# Patient Record
Sex: Male | Born: 1935 | Race: White | Hispanic: No | Marital: Married
Health system: Southern US, Community
[De-identification: ages and names within clinical notes are randomized; demographics above are authoritative.]

---

## 2004-12-17 ENCOUNTER — Inpatient Hospital Stay: Payer: Self-pay | Admitting: Anesthesiology

## 2013-07-12 ENCOUNTER — Emergency Department: Payer: Self-pay | Admitting: Emergency Medicine

## 2013-07-12 ENCOUNTER — Ambulatory Visit: Payer: Self-pay | Admitting: Urology

## 2013-07-12 LAB — BASIC METABOLIC PANEL
Anion Gap: 5 — ABNORMAL LOW (ref 7–16)
BUN: 38 mg/dL — ABNORMAL HIGH (ref 7–18)
Calcium, Total: 8.9 mg/dL (ref 8.5–10.1)
Chloride: 107 mmol/L (ref 98–107)
EGFR (African American): 39 — ABNORMAL LOW
Osmolality: 289 (ref 275–301)
Sodium: 140 mmol/L (ref 136–145)

## 2013-07-12 LAB — CBC
HCT: 29.9 % — ABNORMAL LOW (ref 40.0–52.0)
HGB: 9.4 g/dL — ABNORMAL LOW (ref 13.0–18.0)
MCH: 26 pg (ref 26.0–34.0)
MCV: 83 fL (ref 80–100)
Platelet: 237 10*3/uL (ref 150–440)
RBC: 3.62 10*6/uL — ABNORMAL LOW (ref 4.40–5.90)
RDW: 27.1 % — ABNORMAL HIGH (ref 11.5–14.5)
WBC: 44 10*3/uL — ABNORMAL HIGH (ref 3.8–10.6)

## 2013-07-13 ENCOUNTER — Emergency Department: Payer: Self-pay | Admitting: Emergency Medicine

## 2013-07-13 LAB — PROTIME-INR
INR: 1.2
Prothrombin Time: 15.3 secs — ABNORMAL HIGH (ref 11.5–14.7)

## 2013-07-13 LAB — URINALYSIS, COMPLETE
Ketone: NEGATIVE
Ketone: NEGATIVE
Leukocyte Esterase: NEGATIVE
Nitrite: NEGATIVE
Ph: 5 (ref 4.5–8.0)
Protein: 30
RBC,UR: 3185 /HPF (ref 0–5)
RBC,UR: 68 /HPF (ref 0–5)
Specific Gravity: 1.013 (ref 1.003–1.030)
Specific Gravity: 1.014 (ref 1.003–1.030)
Squamous Epithelial: <1
Squamous Epithelial: NONE SEEN
WBC UR: 5 /HPF (ref 0–5)

## 2013-07-13 LAB — CBC
HCT: 28 % — ABNORMAL LOW (ref 40.0–52.0)
MCH: 26.3 pg (ref 26.0–34.0)
MCV: 83 fL (ref 80–100)
Platelet: 248 10*3/uL (ref 150–440)
RBC: 3.39 10*6/uL — ABNORMAL LOW (ref 4.40–5.90)
RDW: 26.8 % — ABNORMAL HIGH (ref 11.5–14.5)

## 2013-07-13 LAB — BASIC METABOLIC PANEL
Calcium, Total: 8.4 mg/dL — ABNORMAL LOW (ref 8.5–10.1)
Chloride: 109 mmol/L — ABNORMAL HIGH (ref 98–107)
Co2: 26 mmol/L (ref 21–32)
Creatinine: 1.95 mg/dL — ABNORMAL HIGH (ref 0.60–1.30)
EGFR (African American): 35 — ABNORMAL LOW
Osmolality: 291 (ref 275–301)
Potassium: 3.9 mmol/L (ref 3.5–5.1)
Sodium: 141 mmol/L (ref 136–145)

## 2013-08-14 LAB — COMPREHENSIVE METABOLIC PANEL
Anion Gap: 5 — ABNORMAL LOW (ref 7–16)
BUN: 16 mg/dL (ref 7–18)
Bilirubin,Total: 0.3 mg/dL (ref 0.2–1.0)
Calcium, Total: 8.3 mg/dL — ABNORMAL LOW (ref 8.5–10.1)
Chloride: 103 mmol/L (ref 98–107)
Co2: 30 mmol/L (ref 21–32)
EGFR (Non-African Amer.): 46 — ABNORMAL LOW
Glucose: 95 mg/dL (ref 65–99)
Osmolality: 277 (ref 275–301)
Potassium: 3.4 mmol/L — ABNORMAL LOW (ref 3.5–5.1)
SGOT(AST): 19 U/L (ref 15–37)
Sodium: 138 mmol/L (ref 136–145)
Total Protein: 7 g/dL (ref 6.4–8.2)

## 2013-08-14 LAB — CBC
HCT: 24.3 % — ABNORMAL LOW (ref 40.0–52.0)
HGB: 7.4 g/dL — ABNORMAL LOW (ref 13.0–18.0)
MCH: 25.6 pg — ABNORMAL LOW (ref 26.0–34.0)
MCHC: 30.6 g/dL — ABNORMAL LOW (ref 32.0–36.0)
MCV: 83 fL (ref 80–100)
RBC: 2.91 10*6/uL — ABNORMAL LOW (ref 4.40–5.90)
RDW: 22.1 % — ABNORMAL HIGH (ref 11.5–14.5)

## 2013-08-14 LAB — URINALYSIS, COMPLETE
Bilirubin,UR: NEGATIVE
Ketone: NEGATIVE
Nitrite: NEGATIVE
Ph: 6 (ref 4.5–8.0)
Protein: 100
Specific Gravity: 1.012 (ref 1.003–1.030)
Squamous Epithelial: NONE SEEN
WBC UR: 521 /HPF (ref 0–5)

## 2013-08-14 LAB — LIPASE, BLOOD: Lipase: 217 U/L

## 2013-08-15 ENCOUNTER — Inpatient Hospital Stay: Payer: Self-pay | Admitting: Internal Medicine

## 2013-08-15 LAB — IRON AND TIBC
Iron: 25 ug/dL — ABNORMAL LOW (ref 65–175)
Unbound Iron-Bind.Cap.: 149 ug/dL

## 2013-08-15 LAB — PROTIME-INR
INR: 1.2
Prothrombin Time: 15.3 secs — ABNORMAL HIGH (ref 11.5–14.7)

## 2013-08-15 LAB — FOLATE: Folic Acid: 15.6 ng/mL (ref 3.1–100.0)

## 2013-08-15 LAB — BASIC METABOLIC PANEL
Anion Gap: 5 — ABNORMAL LOW (ref 7–16)
BUN: 15 mg/dL (ref 7–18)
Co2: 30 mmol/L (ref 21–32)
Creatinine: 1.27 mg/dL (ref 0.60–1.30)
EGFR (African American): 58 — ABNORMAL LOW
Osmolality: 279 (ref 275–301)
Sodium: 140 mmol/L (ref 136–145)

## 2013-08-15 LAB — HEMOGLOBIN
HGB: 7.4 g/dL — ABNORMAL LOW (ref 13.0–18.0)
HGB: 7 g/dL — ABNORMAL LOW (ref 13.0–18.0)

## 2013-08-16 ENCOUNTER — Ambulatory Visit: Payer: Self-pay | Admitting: Internal Medicine

## 2013-08-16 LAB — BASIC METABOLIC PANEL
Anion Gap: 4 — ABNORMAL LOW (ref 7–16)
BUN: 15 mg/dL (ref 7–18)
Calcium, Total: 8.3 mg/dL — ABNORMAL LOW (ref 8.5–10.1)
Chloride: 104 mmol/L (ref 98–107)
Co2: 30 mmol/L (ref 21–32)
EGFR (African American): 55 — ABNORMAL LOW
Glucose: 87 mg/dL (ref 65–99)
Osmolality: 276 (ref 275–301)

## 2013-08-16 LAB — CBC WITH DIFFERENTIAL/PLATELET
Basophil #: 0 10*3/uL (ref 0.0–0.1)
HCT: 22.3 % — ABNORMAL LOW (ref 40.0–52.0)
Lymphocyte #: 1.3 10*3/uL (ref 1.0–3.6)
Lymphocyte %: 9.2 %
MCH: 26.6 pg (ref 26.0–34.0)
Monocyte %: 5.7 %
Neutrophil #: 11.5 10*3/uL — ABNORMAL HIGH (ref 1.4–6.5)
Neutrophil %: 82.5 %
RBC: 2.67 10*6/uL — ABNORMAL LOW (ref 4.40–5.90)
WBC: 14 10*3/uL — ABNORMAL HIGH (ref 3.8–10.6)

## 2013-08-21 LAB — URINE CULTURE

## 2013-08-29 ENCOUNTER — Ambulatory Visit: Payer: Self-pay | Admitting: Internal Medicine

## 2013-09-29 DEATH — deceased

## 2014-05-10 IMAGING — CT CT ABDOMEN AND PELVIS WITHOUT AND WITH CONTRAST
3 of 10 series · 12 of 46 positions shown, 18 images · IV contrast (isovue)
Comparison: None.

CLINICAL DATA: Hematuria, renal cancer

EXAM:
CT ABDOMEN AND PELVIS WITHOUT AND WITH CONTRAST
TECHNIQUE: Multidetector CT imaging of the abdomen and pelvis was performed
without contrast material in one or both body regions, followed by
contrast material(s) and further sections in one or both body
regions.
CONTRAST:  100 mL Isovue 370 IV

[Series 2: hematuria > 45 wo · axial · 0.89mm/px · z∈[-411,-6]mm · 8 of 105 slices shown, 13 images]
[im 12/105  soft-tissue]
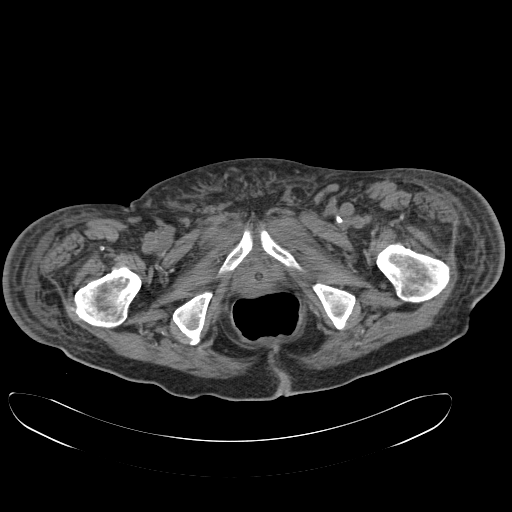
[im 12/105  bone]
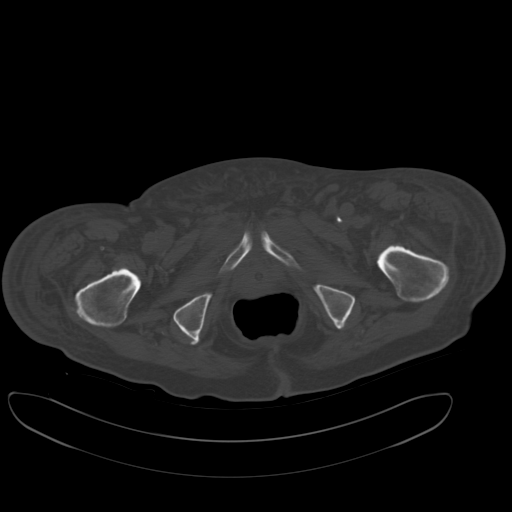
[im 24/105  soft-tissue]
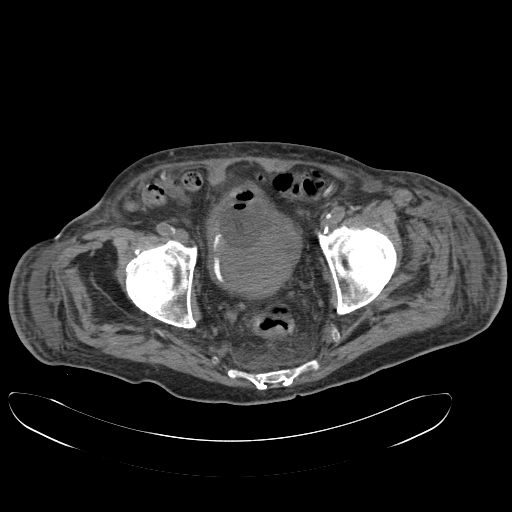
[im 35/105  soft-tissue]
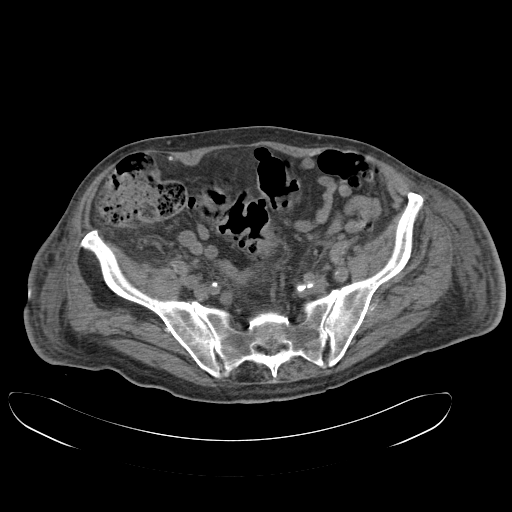
[im 47/105  soft-tissue]
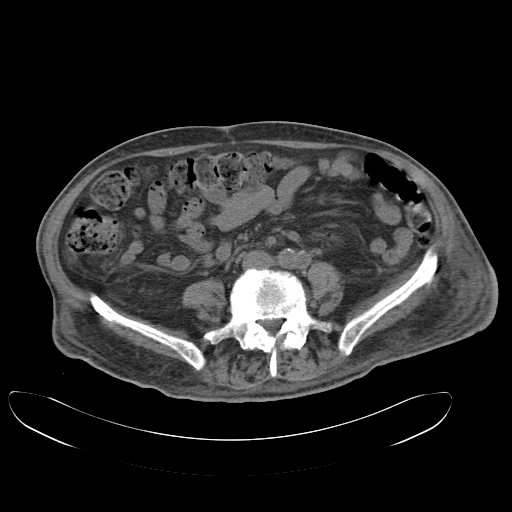
[im 58/105  soft-tissue]
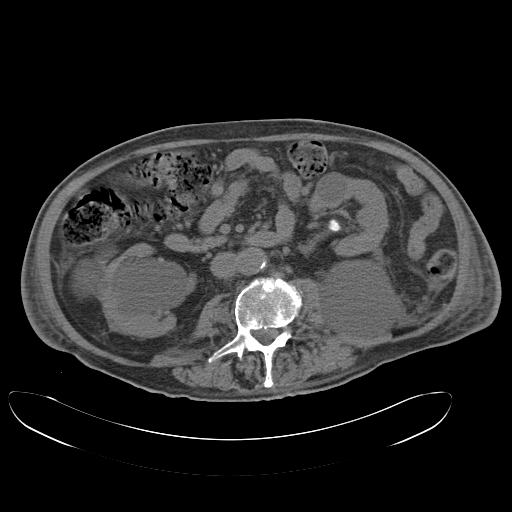
[im 58/105  lung]
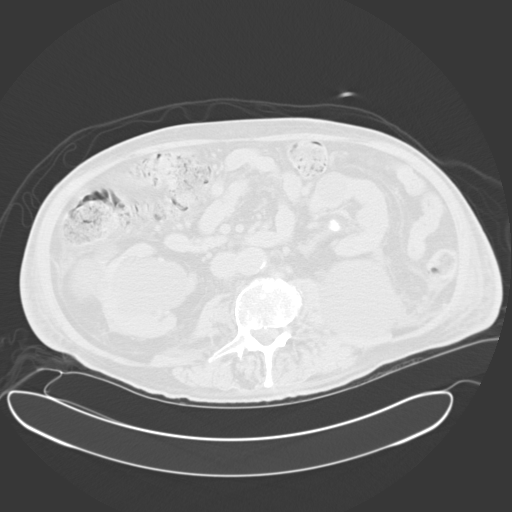
[im 70/105  soft-tissue]
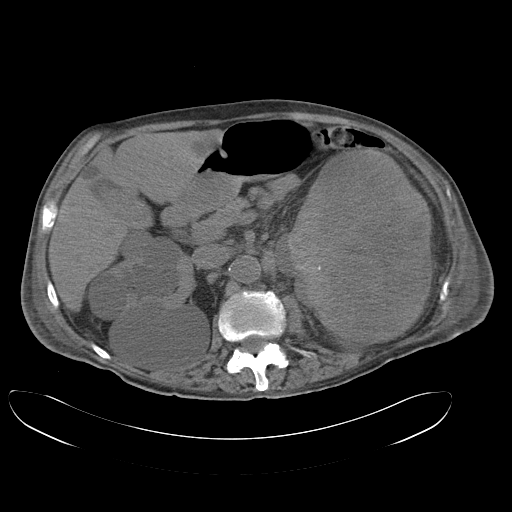
[im 70/105  lung]
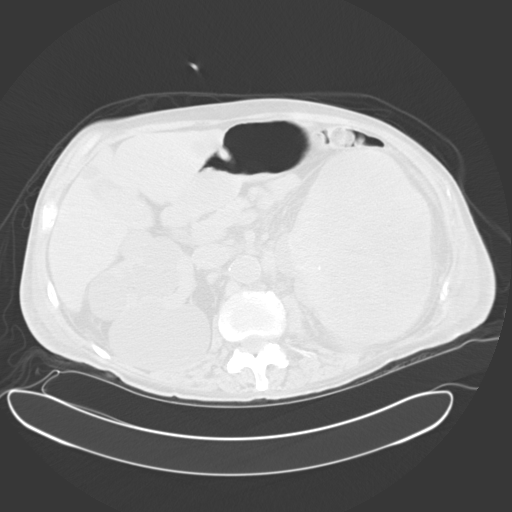
[im 81/105  soft-tissue]
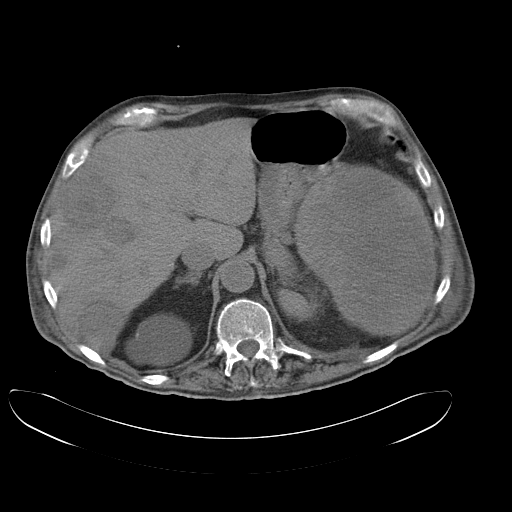
[im 81/105  lung]
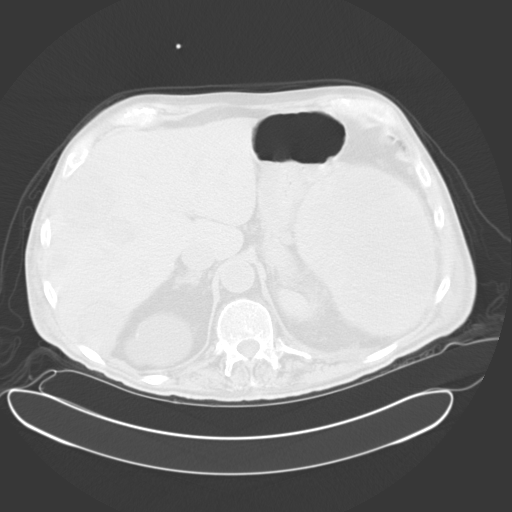
[im 93/105  soft-tissue]
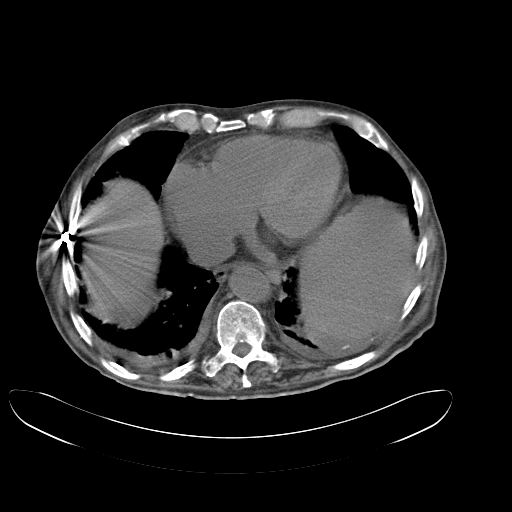
[im 93/105  lung]
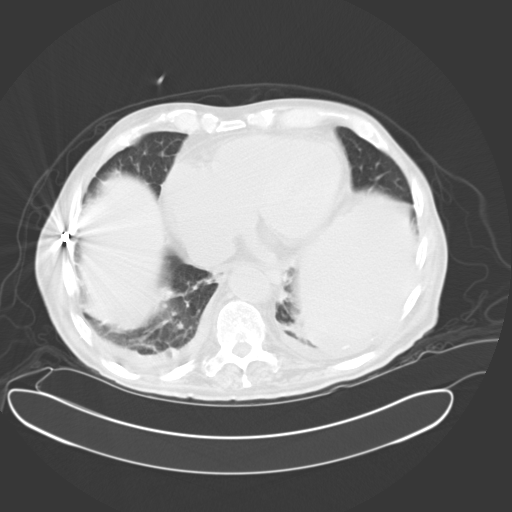

[Series 5: cor hematuria > 45 wo · coronal · 0.94mm/px · 2 of 132 slices shown, 3 images]
[im 44/132  soft-tissue]
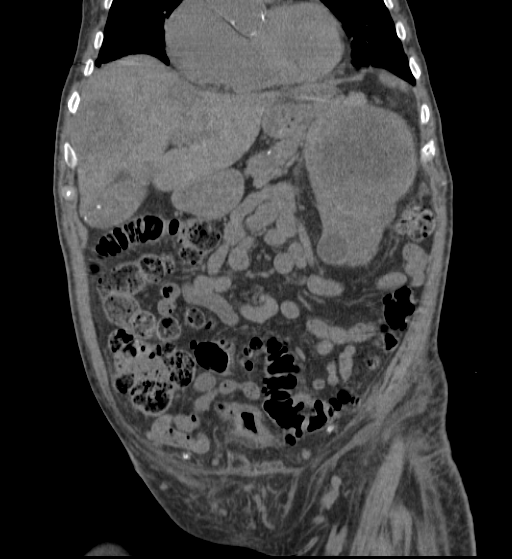
[im 44/132  bone]
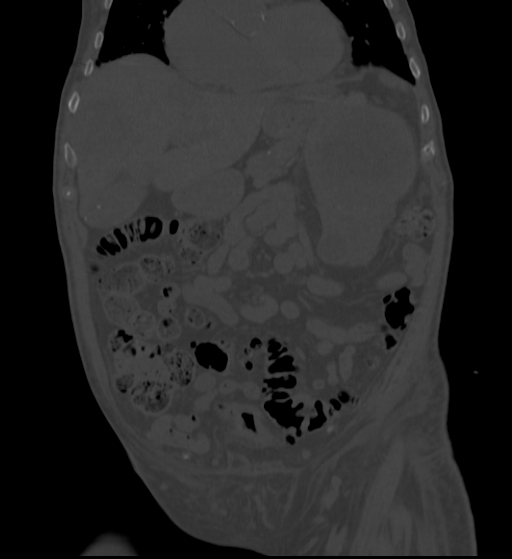
[im 88/132  soft-tissue]
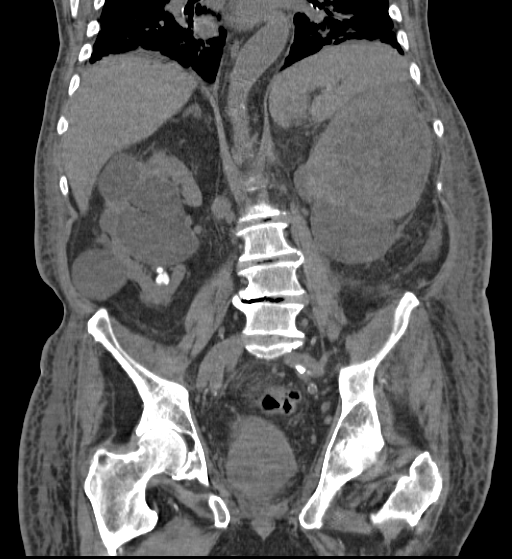

[Series 7: hematuria > 45 post · axial · 0.89mm/px · z∈[-411,-351]mm · 2 of 105 slices shown]
[im 12/105  soft-tissue]
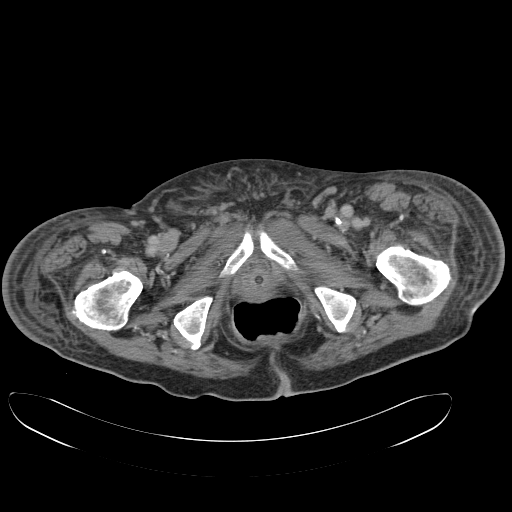
[im 24/105  soft-tissue]
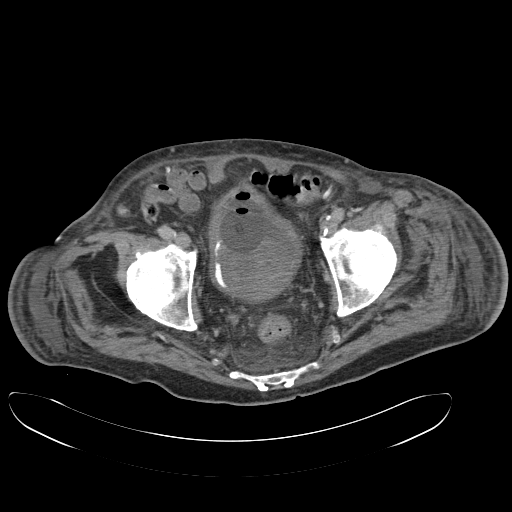

[12 of 46 positions shown; findings below may reference images not displayed]

FINDINGS: Coronary atherosclerosis. Cardiomegaly. Trace pericardial effusion.

Trace bilateral pleural effusions, left greater than right.
Calcified right pleural plaque (series 2/image 10). Shrapnel in the
right posterolateral chest wall.

Bilateral pulmonary nodules/metastases in the visualized lungs,
measuring up to 14 x 17 mm in the left lower lobe (series 4/image
19).

Multiple hepatic metastases, including:

--4.0 x 3.5 cm lesion in the medial segment left hepatic dome
(series 7/image 16)

--4.9 x 4.7 cm lesion in the anterior segment right hepatic lobe
with surrounding altered perfusion (series 7/image 24)

--4.1 x 4.5 cm lesion in the posterior segment right hepatic lobe
(series 7/image 24)

Spleen, pancreas, and adrenal glands are within normal limits.

17.8 x 12.9 x 13.2 cm centrally necrotic mass arising from the left
upper kidney (series 7/image 32), compatible with renal cell
carcinoma.

Additional bilateral renal cysts of varying sizes and complexity.
Numerous layering calculi in the right lower kidney. Multiple
nonobstructing calculi in the left lower kidney. No hydronephrosis.

No evidence of bowel obstruction. Normal appendix (series 2/image
76).

Atherosclerotic calcifications of the abdominal aorta and branch
vessels. IVC filter.

Retroperitoneal nodes, including a 14 mm left para-aortic node
(series 7/ image 43), suspicious for nodal metastases.

No abdominopelvic ascites.

Prostatomegaly with enlargement of the central gland which indents
the base of the bladder.

Bladder is decompressed with indwelling Foley catheter and foci of
nondependent gas. Suspected layering right bladder calculi (series
2/image 81).

Degenerative changes of the visualized thoracolumbar spine. Mild
superior endplate compression deformity at L1 (sagittal image [AGE] indeterminate. No retropulsion.
IMPRESSION: 17.8 cm necrotic left upper pole renal mass, compatible renal cell
carcinoma.

Associated hepatic metastases measuring up to 4.9 cm, as described
above.

Suspected retroperitoneal nodal metastases.

Bilateral pulmonary nodules/metastases in the visualized lungs,
measuring up to 1.7 cm in the left lower lobe.

Additional ancillary findings as above.

## 2014-12-19 NOTE — Consult Note (Signed)
PATIENT NAME:  Brian Hutchinson, Jastin A MR#:  [id number] DATE OF BIRTH:  05-Nov-1922  DATE OF CONSULTATION:  07/12/2013  REFERRING PHYSICIAN:   CONSULTING PHYSICIAN:  Tawni MillersEdward R. Weldon InchesHouser, II, MD  REASON FOR CONSULTATION:  Difficult Foley placement.   HISTORY OF PRESENT ILLNESS:  The patient is an 81 year old male who has known left kidney cancer with lung metastatic disease who is being [facility] and [facility] who presents to [facility] with inability to void x 6 hours of greater than 400 mL on his bladder scan.  Nursing and the ED staff were unable to place a Foley catheter due to severe phimosis.  The patient has had phimosis before and it has been managed with creams.  He has had Foley placement before as well for a UTI per his wife.  The patient is a poor historian.  He is otherwise doing well.  Denied any fever, chills or sweats.  He does have some total body fluid overload and this penile edema.  His phimosis is acute per his wife.   REVIEW OF SYSTEMS:  A balance of 11 systems otherwise negative.   PAST MEDICAL HISTORY:  No prostate or urologic surgery.  Left kidney cancer noted.  History of irregular heartbeat.   ALLERGIES:  None.   MEDICATIONS:  Up-to-date, reviewed in chart.   SOCIAL HISTORY:  Lives with wife.   FAMILY HISTORY:  Noncontributory.   PHYSICAL EXAMINATION:  VITAL SIGNS:  Temperature is 97.5, heart rate 108, respiratory rate 20, blood pressure 170/98, sating 94% on room air.  GENERAL:  Alert and oriented x 2, conversational, elderly-appearing male.  HEENT:  Normocephalic, atraumatic.  CHEST:  Clear and nonlabored.  ABDOMEN:  Soft, nontender, nondistended.  He has a palpable bladder.  GENITOURINARY:  Phimotic and edematous penis and bilateral descended testes.  RECTAL:  Deferred.  EXTREMITIES:  He has 2+ edema bilateral lower extremities.  NEUROLOGIC:  Appears grossly intact.   MUSCULOSKELETAL:  Five out of five strength.   SKIN:  No rashes or lesions noted.  PSYCH:  Pleasant and appropriate.   LABORATORY DATA:  The patient's current creatinine is 1.7 with an estimated GFR of 33.   ASSESSMENT:  An 81 year old with phimosis and urinary retention, nursing staff unable to place a Foley catheter.  I was ultimately able to place a 16 [place] regular catheter blindly by trapping the glans within the phimotic foreskin.  The Foley passed through the urethra and the prostate without any difficulty, 10 mL of sterile water was placed in the balloon and it drained clear with excess of 400 mL.  This was left in the to bag drainage.   PLAN:   1.  Antibiotics per ED.  2.  Disposition per ED.  3.  Recommend followup with his primary urolog[facility] at [facility].  Wife will call to arrange appointment.    ____________________________ Tawni MillersEdward R. Weldon InchesHouser, II, MD erh:ea D: 07/13/2013 00:03:00 ET T: 07/13/2013 00:57:35 ET JOB#: [id number]  cc: Tawni MillersEdward R. Weldon InchesHouser, II, MD, <Dictator> Beaulah CorinEDWARD R Marisol Giambra MD ELECTRONICALLY SIGNED 07/14/2013 8:45

## 2014-12-19 NOTE — Consult Note (Signed)
Brief Consult Note: Diagnosis: anemia/hemoccult postive stool.   Patient was seen by consultant.   Consult note dictated.   Comments: [name] is a pleasant 81 y/o caucasian male with metastatic left renal carcinoma to lung on hospice care with suprapubic pain, gross hematuria & found to be hemoccult positive.  Pt is on hospice care for terminal cancer & GI work-up for anemia is not indicated as his anemia can be explained by his advanced cancer & large amounts of gross hematuria.  Also, pt does not want further GI work-up at this time as he understands his advanced disease.  Would recommend palliative care only at this point.  I have discussed with Dr Juliene PinaMody who agrees with this plan.  Thanks for consult.  Will sign off.  Please see full dictated note. #[id number].  Electronic Signatures: Brian Hutchinson, Brian Hutchinson (NP)  (Signed 18-Dec-14 09:33)  Authored: Brief Consult Note   Last Updated: 18-Dec-14 09:33 by Brian Hutchinson, Brian Hutchinson (NP)

## 2014-12-19 NOTE — Consult Note (Signed)
Brief Consult Note: Diagnosis: Urinary Retention.   Patient was seen by consultant.   Consult note dictated.   Recommend further assessment or treatment.   Discussed with Attending MD.   Comments: 81 yo with severe phimosis and urinary retention >400cc.  Nursing and ED staff unable to place foley.  16Fr placed with difficulty.  Drained clear.  Electronic Signatures: Charyl BiggerHouser, Liller Yohn Ross (MD)  (Signed [phone]-Nov-14 23:54)  Authored: Brief Consult Note   Last Updated: 14-Nov-14 23:54 by Charyl BiggerHouser, Ayomide Purdy Ross (MD)
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
## 2014-12-19 NOTE — Discharge Summary (Signed)
PATIENT NAME:  Brian Hutchinson, [name] MR#:  [id number] DATE OF BIRTH:  1922-12-31  DATE OF ADMISSION:  08/15/2013 DATE OF DISCHARGE:  08/16/2013  ADMISSION DIAGNOSES: 1.  Urinary tract infection.  2.  Urinary retention.   DISCHARGE DIAGNOSES: 1.  Urinary tract infection.  2.  Urinary retention with chronic Foley catheter placement.  3.  History of kidney cancer with metastatic disease.  4.  Anemia, acute blood loss.   CONSULTATIONS:  1.  GI.  2.  Palliative care.   PERTINENT LABORATORIES AT DISCHARGE: Sodium 138, potassium 3.5, chloride 104, bicarb 30, BUN 15, creatinine 1.32. Glucose is 87. Hemoglobin is 7.1. Hematocrit 22.3, white blood cells 14. Platelets are 309.   CT of the abdomen and pelvis showed a 17.8 cm necrotic left upper pole renal mass compatible with renal cell carcinoma with hepatic mets measuring up to 4.9 cm.   HOSPITAL COURSE: An 81 year old male who was complaining of pubic pain, found to have urinary retention and urinary tract infection. For further details, please refer to the H and P.  1.  Urinary tract infection. The patient was on antibiotics prior to hospitalization. He was started on Rocephin here. There was no urine culture ordered. He was doing well with the Rocephin. I changed this to Keflex. I suspect his UTI is secondary to urinary retention.  2.  Urinary retention. The patient does have a chronic Foley, which was placed about a month ago. He had some hematuria; therefore, a CT scan was ordered. I suspect his hematuria is secondary to his kidney cancer, as well as trauma from the Foley.  For his urinary retention,  we increased the Flomax and also started belladonna. We will continue finasteride. Wife is to check his urine output on a daily basis and notify hospice if there is decreased urine output.  3.  Anemia of acute blood loss with guaiac-positive stools. GI was consulted. Obviously, in this patient, they did not recommend aggressive management and to treat  conservatively. His hemoglobin had remained stable.  4.  Kidney malignancy. The patient is on hospice.    DISCHARGE MEDICATIONS: 1.  Keflex 500 mg p.o. t.i.d. x10 days.  2.  Finasteride 5 mg daily.  3.  Atenolol 25 mg b.i.d.  4.  Aspirin 81 mg daily.  5.  Ativan 0.5 q.4 hours p.r.n.  6.  Flomax 0.4 mg 2 tablets daily.  7.  Nexium 40 mg daily.  8.  Belladonna opium 1 suppository q.8 hours p.r.n. pain and bladder spasms.   DISCHARGE INSTRUCTIONS:  Foley to leg bag. Check urine output daily. If you notice any significant decrease, then please call hospice for evaluation of urinary retention.   DISCHARGE DIET: Regular diet.   DISCHARGE REFERRAL: Hospice.   The patient is medically stable for discharge home, back with hospice.   TIME SPENT: Approximately 35 minutes.   ____________________________ Janyth ContesSital P. Juliene PinaMody, MD spm:dmm D: 08/16/2013 13:39:38 ET T: 08/16/2013 22:12:24 ET JOB#: [id number]  cc: Jeweldean Drohan P. Juliene PinaMody, MD, <Dictator> Haliegh Khurana P Christy Ehrsam MD ELECTRONICALLY SIGNED 08/22/2013 2:01 Shandel Busic P Georgie Eduardo MD ELECTRONICALLY SIGNED 08/22/2013 2:13

## 2014-12-19 NOTE — H&P (Signed)
PATIENT NAME:  Larena SoxWARREN, Rishon A MR#:  [id number] DATE OF BIRTH:  11-Apr-1923  DATE OF ADMISSION:  08/15/2013  ADDENDUM   PHYSICAL EXAMINATION: Rectal exam was done by me. The patient has a large prostate. The stool was normal light brown in color but was Hemoccult positive.   ASSESSMENT AND PLAN:  Anemia: The patient is Hemoccult positive, so most likely there is a mild source of gastrointestinal bleed contributing to it. Will start the patient on intravenous Protonix 40 mg b.i.d. We will hold his subcutaneous heparin, already holding his aspirin and will consult gastroenterology service. Will check hemoglobin in a.m. and will transfuse if needed or hemoglobin drops below 7.   Deep vein thrombosis prophylaxis: The patient had and [facility] [place] compression device due to recent diagnosis of deep vein thrombosis. Cannot be on any chemical anticoagulation due to his Hemoccult stool and significant anemia.   ____________________________ Starleen Armsawood S. Coty Student, MD dse:gb D: 08/15/2013 03:01:00 ET T: 08/15/2013 03:48:39 ET JOB#: [id number]  cc: Starleen Armsawood S. Mylo Choi, MD, <Dictator> Jacobi Nile Teena IraniS Alexie Samson MD ELECTRONICALLY SIGNED 08/16/2013 6:50

## 2014-12-19 NOTE — Consult Note (Signed)
   Comments   Pt sleeping. Wife not present. Will see in AM.   Electronic Signatures: Merl Bommarito, Harriett SineNancy (MD)  (Signed 18-Dec-14 17:20)  Authored: Palliative Care   Last Updated: 18-Dec-14 17:20 by Kerriann Kamphuis, Harriett SineNancy (MD)
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
